# Patient Record
Sex: Male | Born: 1975 | Hispanic: Yes | Marital: Married | Smoking: Never smoker
Health system: Southern US, Community
[De-identification: ages and names within clinical notes are randomized; demographics above are authoritative.]

## PROBLEM LIST (undated history)

## (undated) DIAGNOSIS — T7840XA Allergy, unspecified, initial encounter: Secondary | ICD-10-CM

## (undated) HISTORY — DX: Allergy, unspecified, initial encounter: T78.40XA

---

## 2018-08-08 ENCOUNTER — Encounter (HOSPITAL_COMMUNITY): Payer: Self-pay | Admitting: Emergency Medicine

## 2018-08-08 ENCOUNTER — Ambulatory Visit (HOSPITAL_COMMUNITY)
Admission: EM | Admit: 2018-08-08 | Discharge: 2018-08-08 | Disposition: A | Discharge status: Home / Self Care | Payer: Self-pay | Attending: Family Medicine | Admitting: Family Medicine

## 2018-08-08 DIAGNOSIS — M7022 Olecranon bursitis, left elbow: Secondary | ICD-10-CM

## 2018-08-08 MED ORDER — CEPHALEXIN 500 MG PO CAPS: 500.0000 mg | ORAL_CAPSULE | Freq: Four times a day (QID) | ORAL | 0 refills | Status: AC

## 2018-08-08 MED ORDER — NAPROXEN 500 MG PO TABS: 500.0000 mg | ORAL_TABLET | Freq: Two times a day (BID) | ORAL | 0 refills | Status: DC

## 2018-08-08 NOTE — ED Triage Notes (Signed)
Pt here for left elbow pain with some swelling x 2 days

## 2018-08-08 NOTE — Discharge Instructions (Signed)
I believe your elbow pain and swelling is more from inflammation Use anti-inflammatories for pain/swelling. You may take up to 800 mg Ibuprofen every 8 hours with food. You may supplement Ibuprofen with Tylenol [phone] mg every 8 hours.  OR Naprosyn twice daily with food  Wear Ace wrap to help with swelling and apply compression May ice when at home  May fill prescription for Keflex if having worsening redness and pain concerning for infection  Please follow-up if not having any improvement with the above in 1 week or symptoms worsening

## 2018-08-08 NOTE — ED Provider Notes (Signed)
[place]    CSN: [id number] Arrival date & time: 08/08/18  1218     History   Chief Complaint Chief Complaint  Patient presents with  . Elbow Pain    appt 1230    HPI Thomas Chapman is a 43 y.o. male no significant past medical history presenting today for evaluation of left elbow pain.  Patient states that over the past 2 days he has had increased pain, swelling and redness to his left elbow.  Denies any injury.  Denies history of similar.  Denies difficulty moving elbow.  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  Denies numbness or tingling.  HPI  History reviewed. No pertinent past medical history.  There are no active problems to display for this patient.   History reviewed. No pertinent surgical history.     Home Medications    Prior to Admission medications   Medication Sig Start Date End Date Taking? Authorizing Provider  cephALEXin (KEFLEX) 500 MG capsule Take 1 capsule (500 mg total) by mouth 4 (four) times daily for 5 days. 08/08/18 08/13/18  Wieters, Hallie C, PA-C  naproxen (NAPROSYN) 500 MG tablet Take 1 tablet (500 mg total) by mouth 2 (two) times daily. 08/08/18   Wieters, Elesa Hacker, PA-C    Family History Family History  Problem Relation Age of Onset  . Healthy Mother   . Healthy Father     Social History Social History   Tobacco Use  . Smoking status: Never Smoker  . Smokeless tobacco: Never Used  Substance Use Topics  . Alcohol use: Not Currently  . Drug use: Never     Allergies   Patient has no known allergies.   Review of Systems Review of Systems  Constitutional: Negative for fatigue and fever.  Eyes: Negative for redness, itching and visual disturbance.  Respiratory: Negative for shortness of breath.   Cardiovascular: Negative for chest pain and leg swelling.  Gastrointestinal: Negative for nausea and vomiting.  Musculoskeletal: Positive for arthralgias and joint swelling. Negative  for myalgias.  Skin: Positive for color change. Negative for rash and wound.  Neurological: Negative for dizziness, syncope, weakness, light-headedness and headaches.     Physical Exam Triage Vital Signs ED Triage Vitals  Enc Vitals Group     BP 08/08/18 1240 125/89     Pulse Rate 08/08/18 1240 79     Resp 08/08/18 1240 18     Temp 08/08/18 1240 98.1 F (36.7 C)     Temp Source 08/08/18 1240 Oral     SpO2 08/08/18 1240 100 %     Weight --      Height --      Head Circumference --      Peak Flow --      Pain Score 08/08/18 1241 7     Pain Loc --      Pain Edu? --      Excl. in [place]? --    No data found.  Updated Vital Signs BP 125/89 (BP Location: Right Arm)   Pulse 79   Temp 98.1 F (36.7 C) (Oral)   Resp 18   SpO2 100%   Visual Acuity Right Eye Distance:   Left Eye Distance:   Bilateral Distance:    Right Eye Near:   Left Eye Near:    Bilateral Near:     Physical Exam Vitals signs and nursing note reviewed.  Constitutional:      Appearance: He is  well-developed.     Comments: No acute distress  HENT:     Head: Normocephalic and atraumatic.     Nose: Nose normal.  Eyes:     Conjunctiva/sclera: Conjunctivae normal.  Neck:     Musculoskeletal: Neck supple.  Cardiovascular:     Rate and Rhythm: Normal rate.  Pulmonary:     Effort: Pulmonary effort is normal. No respiratory distress.  Abdominal:     General: There is no distension.  Musculoskeletal: Normal range of motion.     Comments: Left elbow with mild swelling over olecranon bursa and to proximal forearm posteriorly, mild faint erythema, mild warmth Full active range of motion at elbow Radial pulse 2+  Skin:    General: Skin is warm and dry.  Neurological:     Mental Status: He is alert and oriented to person, place, and time.      UC Treatments / Results  Labs (all labs ordered are listed, but only abnormal results are displayed) Labs Reviewed - No data to  display  EKG None  Radiology No results found.  Procedures Procedures (including critical care time)  Medications Ordered in UC Medications - No data to display  Initial Impression / Assessment and Plan / UC Course  I have reviewed the triage vital signs and the nursing notes.  Pertinent labs & imaging results that were available during my care of the patient were reviewed by me and considered in my medical decision making (see chart for details).     Symptoms seem likely from olecranon bursitis, does not seem infectious at this time is full range of motion erythema faint.  Will recommend NSAIDs, Ace wrap and ice.  Did provide prescription for Keflex given patient and his wife's concern and advised that if redness becoming more prominent and having worsening pain may fill, if still not resolving please follow-up.  Continue to monitor,Discussed strict return precautions. Patient verbalized understanding and is agreeable with plan.  Final Clinical Impressions(s) / UC Diagnoses   Final diagnoses:  Olecranon bursitis of left elbow     Discharge Instructions     I believe your elbow pain and swelling is more from inflammation Use anti-inflammatories for pain/swelling. You may take up to 800 mg Ibuprofen every 8 hours with food. You may supplement Ibuprofen with Tylenol [phone] mg every 8 hours.  OR Naprosyn twice daily with food  Wear Ace wrap to help with swelling and apply compression May ice when at home  May fill prescription for Keflex if having worsening redness and pain concerning for infection  Please follow-up if not having any improvement with the above in 1 week or symptoms worsening   ED Prescriptions    Medication Sig Dispense Auth. Provider   naproxen (NAPROSYN) 500 MG tablet Take 1 tablet (500 mg total) by mouth 2 (two) times daily. 30 tablet Wieters, Hallie C, PA-C   cephALEXin (KEFLEX) 500 MG capsule Take 1 capsule (500 mg total) by mouth 4 (four) times  daily for 5 days. 20 capsule Wieters, Hallie C, PA-C     Controlled Substance Prescriptions  Controlled Substance Registry consulted? Not Applicable   Janith Lima, [place] 08/08/18 1314

## 2019-11-29 ENCOUNTER — Other Ambulatory Visit: Payer: Self-pay | Admitting: Medical

## 2019-11-29 ENCOUNTER — Other Ambulatory Visit: Payer: Self-pay

## 2019-11-29 ENCOUNTER — Encounter: Payer: Self-pay | Admitting: Medical

## 2019-11-29 ENCOUNTER — Ambulatory Visit (INDEPENDENT_AMBULATORY_CARE_PROVIDER_SITE_OTHER): Payer: Self-pay | Admitting: Medical

## 2019-11-29 VITALS — BP 130/80 | HR 99 | Resp 18 | Ht 66.0 in | Wt 135.0 lb

## 2019-11-29 DIAGNOSIS — K6289 Other specified diseases of anus and rectum: Secondary | ICD-10-CM

## 2019-11-29 DIAGNOSIS — J301 Allergic rhinitis due to pollen: Secondary | ICD-10-CM

## 2019-11-29 DIAGNOSIS — R42 Dizziness and giddiness: Secondary | ICD-10-CM

## 2019-11-29 DIAGNOSIS — R030 Elevated blood-pressure reading, without diagnosis of hypertension: Secondary | ICD-10-CM

## 2019-11-29 DIAGNOSIS — R5383 Other fatigue: Secondary | ICD-10-CM

## 2019-11-29 MED ORDER — HYDROCORTISONE ACETATE 25 MG RE SUPP: 25.0000 mg | Freq: Two times a day (BID) | RECTAL | 0 refills | Status: DC

## 2019-11-29 MED ORDER — CETIRIZINE HCL 10 MG PO TABS: 10.0000 mg | ORAL_TABLET | Freq: Every day | ORAL | 3 refills | Status: DC

## 2019-11-29 MED ORDER — FLUTICASONE PROPIONATE 50 MCG/ACT NA SUSP: 2.0000 | Freq: Every day | NASAL | 1 refills | Status: DC

## 2019-11-29 NOTE — Progress Notes (Signed)
Subjective:    Patient ID: Thomas Chapman, male    DOB: 1975/10/01, 44 y.o.   MRN: [id number]  HPI  Pt in for first time.  Pt works Architect. Pt states recently trying to eat better. Less spicy. No alcohol use. Stopped sodas one week. Non smoker.  Pt states feels good today.   He states he feels good. Here since 2 years did not get physical.   He also states occasional dizziness. Mild and transient. usually he get with allergies. Usually in spring only. Recently no nasal congestion, no runny nose and not sneezing. No current dizziness.  Also he feels mild fatigue recently.    Pt states also recently when eats it will be spicy foods. He states some slight burning when he defecated for 2 weeks.. Now he has some pain minimal on and off now. He is about 70% better. Pt wife gave him medications to stop fungus.      Review of Systems  Constitutional: Positive for fatigue. Negative for chills and fever.  HENT:       Allergy history.  Respiratory: Negative for cough, chest tightness, shortness of breath and wheezing.   Cardiovascular: Negative for chest pain and palpitations.  Gastrointestinal: Negative for abdominal pain.  Musculoskeletal: Negative for back pain.  Skin: Negative for rash.  Neurological: Positive for dizziness.       Rare dizziness.  Hematological: Negative for adenopathy. Does not bruise/bleed easily.  Psychiatric/Behavioral: Negative for behavioral problems and decreased concentration. The patient is not nervous/anxious.     No past medical history on file.   Social History   Socioeconomic History   Marital status: Married    Spouse name: Not on file   Number of children: Not on file   Years of education: Not on file   Highest education level: Not on file  Occupational History   Not on file  Tobacco Use   Smoking status: Never Smoker   Smokeless tobacco: Never Used  Substance and Sexual Activity   Alcohol use: Not Currently   Drug use:  Never   Sexual activity: Not on file  Other Topics Concern   Not on file  Social History Narrative   Not on file   Social Determinants of Health   Financial Resource Strain:    Difficulty of Paying Living Expenses:   Food Insecurity:    Worried About [place] in the Last Year:    Arboriculturist in the Last Year:   Transportation Needs:    Film/video editor (Medical):    Lack of Transportation (Non-Medical):   Physical Activity:    Days of Exercise per Week:    Minutes of Exercise per Session:   Stress:    Feeling of Stress :   Social Connections:    Frequency of Communication with Friends and Family:    Frequency of Social Gatherings with Friends and Family:    Attends Religious Services:    Active Member of Clubs or Organizations:    Attends Music therapist:    Marital Status:   Intimate Partner Violence:    Fear of Current or Ex-Partner:    Emotionally Abused:    Physically Abused:    Sexually Abused:     No past surgical history on file.  Family History  Problem Relation Age of Onset   Healthy Mother    Healthy Father     No Known Allergies  Current Outpatient Medications on File Prior to  Visit  Medication Sig Dispense Refill   naproxen (NAPROSYN) 500 MG tablet Take 1 tablet (500 mg total) by mouth 2 (two) times daily. (Patient not taking: Reported on 11/29/2019) 30 tablet 0   No current facility-administered medications on file prior to visit.    BP (!) 147/82 (BP Location: Left Arm, Patient Position: Sitting, Cuff Size: Normal)    Pulse 99    Resp 18    Ht 5\' 6"  (1.676 m)    Wt 135 lb (61.2 kg)    SpO2 98%    BMI 21.79 kg/m       Objective:   Physical Exam  General  Mental Status - Alert. General Appearance - Well groomed. Not in acute distress.  Skin Rashes- No Rashes.  HEENT Head- Normal. Ear Auditory Canal - Left- Normal. Right - Normal.Tympanic Membrane- Left- Normal. Right-  Normal. Eye Sclera/Conjunctiva- Left- Normal. Right- Normal. Nose & Sinuses Nasal Mucosa- Left-  Boggy and Congested. Right-  Boggy and  Congested.Bilateral  No maxillary and no frontal sinus pressure. Mouth & Throat Lips: Upper Lip- Normal: no dryness, cracking, pallor, cyanosis, or vesicular eruption. Lower Lip-Normal: no dryness, cracking, pallor, cyanosis or vesicular eruption. Buccal Mucosa- Bilateral- No Aphthous ulcers. Oropharynx- No Discharge or Erythema. Tonsils: Characteristics- Bilateral- No Erythema or Congestion. Size/Enlargement- Bilateral- No enlargement. Discharge- bilateral-None.  Neck Neck- Supple. No Masses.   Chest and Lung Exam Auscultation: Breath Sounds:-Clear even and unlabored.  Cardiovascular Auscultation:Rythm- Regular, rate and rhythm. Murmurs & Other Heart Sounds:Ausculatation of the heart reveal- No Murmurs.  Lymphatic Head & Neck General Head & Neck Lymphatics: Bilateral: Description- No Localized lymphadenopathy.   Rectum- no obvious hemorrhoid. But on exam area about 9 o clock. Tender area raised area appears minimal inflamed.     Assessment & Plan:  You do have history of intermittent and transient dizziness that seems to be associated with allergic rhinitis in the past.  Also some recent fatigue.  For this complaints will get CBC, CMP and TSH.  Giving you written print prescription for Zyrtec and Flonase.  You can use any for allergy signs/symptoms.  We will see if any possible cause for transient dizziness on lab work.   Your recent rectal pain might be hemorrhoid related.  Pain is much improved recently but you still have slight tender area on right side rectum.  Will prescribe Anusol HC suppositories and see how you respond.  Stay well-hydrated and eat high-fiber diet.  Try to avoid constipation/excess strain.  If your symptoms persist despite Anusol HC suppository then can refer to GI MD.  You do have borderline elevated blood pressure  today.  Advised low-salt diet.  Will get lipid panel with today's labs.  Follow-up in 3 weeks or as needed.   Mackie Pai, PA-C   Time spent with patient today was new  30  minutes which consisted of discussing diagnoses, work up, treatment, answering and documentation.

## 2019-11-29 NOTE — Patient Instructions (Addendum)
You do have history of intermittent and transient dizziness that seems to be associated with allergic rhinitis in the past.  Also some recent fatigue.  For this complaints will get CBC, CMP and TSH.  Giving you written print prescription for Zyrtec and Flonase.  You can use any for allergy signs/symptoms.  We will see if any possible cause for transient dizziness on lab work.   Your recent rectal pain might be hemorrhoid related.  Pain is much improved recently but you still have slight tender area on right side rectum.  Will prescribe Anusol HC suppositories and see how you respond.  Stay well-hydrated and eat high-fiber diet.  Try to avoid constipation/excess strain.  If your symptoms persist despite Anusol HC suppository then can refer to GI MD.  You do have borderline elevated blood pressure today.  Advised low-salt diet.  Will get lipid panel with today's labs.  Follow-up in 3 weeks or as needed.

## 2019-11-30 LAB — COMPREHENSIVE METABOLIC PANEL
AG Ratio: 1.7 (calc) (ref 1.0–2.5)
ALT: 14 U/L (ref 9–46)
AST: 16 U/L (ref 10–40)
Albumin: 4.5 g/dL (ref 3.6–5.1)
Alkaline phosphatase (APISO): 86 U/L (ref 36–130)
BUN: 19 mg/dL (ref 7–25)
CO2: 27 mmol/L (ref 20–32)
Calcium: 9.4 mg/dL (ref 8.6–10.3)
Chloride: 104 mmol/L (ref 98–110)
Creat: 1.09 mg/dL (ref 0.60–1.35)
Globulin: 2.6 g/dL (calc) (ref 1.9–3.7)
Glucose, Bld: 93 mg/dL (ref 65–99)
Potassium: 4 mmol/L (ref 3.5–5.3)
Sodium: 140 mmol/L (ref 135–146)
Total Bilirubin: 1 mg/dL (ref 0.2–1.2)
Total Protein: 7.1 g/dL (ref 6.1–8.1)

## 2019-11-30 LAB — CBC WITH DIFFERENTIAL/PLATELET
Absolute Monocytes: 364 cells/uL (ref 200–950)
Basophils Absolute: 31 cells/uL (ref 0–200)
Basophils Relative: 0.6 %
Eosinophils Absolute: 52 cells/uL (ref 15–500)
Eosinophils Relative: 1 %
HCT: 43 % (ref 38.5–50.0)
Hemoglobin: 14.2 g/dL (ref 13.2–17.1)
Lymphs Abs: 1258 cells/uL (ref 850–3900)
MCH: 27.5 pg (ref 27.0–33.0)
MCHC: 33 g/dL (ref 32.0–36.0)
MCV: 83.2 fL (ref 80.0–100.0)
MPV: 9.2 fL (ref 7.5–12.5)
Monocytes Relative: 7 %
Neutro Abs: 3494 cells/uL (ref 1500–7800)
Neutrophils Relative %: 67.2 %
Platelets: 288 10*3/uL (ref 140–400)
RBC: 5.17 10*6/uL (ref 4.20–5.80)
RDW: 12.5 % (ref 11.0–15.0)
Total Lymphocyte: 24.2 %
WBC: 5.2 10*3/uL (ref 3.8–10.8)

## 2019-11-30 LAB — LIPID PANEL
Cholesterol: 175 mg/dL (ref <200)
HDL: 56 mg/dL (ref >=40)
LDL Cholesterol (Calc): 102 mg/dL (calc) — ABNORMAL HIGH
Non-HDL Cholesterol (Calc): 119 mg/dL (calc) (ref <130)
Total CHOL/HDL Ratio: 3.1 (calc) (ref <5.0)
Triglycerides: 77 mg/dL (ref <150)

## 2019-11-30 LAB — TSH: TSH: 0.56 mIU/L (ref 0.40–4.50)

## 2019-12-27 ENCOUNTER — Other Ambulatory Visit: Payer: Self-pay

## 2019-12-27 ENCOUNTER — Ambulatory Visit (INDEPENDENT_AMBULATORY_CARE_PROVIDER_SITE_OTHER): Payer: Self-pay | Admitting: Medical

## 2019-12-27 VITALS — BP 136/70 | HR 66 | Resp 18 | Ht 66.0 in | Wt 176.0 lb

## 2019-12-27 DIAGNOSIS — K6289 Other specified diseases of anus and rectum: Secondary | ICD-10-CM

## 2019-12-27 DIAGNOSIS — J301 Allergic rhinitis due to pollen: Secondary | ICD-10-CM

## 2019-12-27 DIAGNOSIS — R42 Dizziness and giddiness: Secondary | ICD-10-CM

## 2019-12-27 NOTE — Progress Notes (Signed)
Subjective:    Patient ID: Thomas Chapman, male    DOB: December 28, 1975, 44 y.o.   MRN: [id number]  HPI  Pt in states his dizziness has resolved. He states he is eating healthy diet. A lot of fruits and vegetables. Stopped eating junk foods and symptoms resolved. No reoccurence.  Pt did use flonase and zyrtec briefly but not using now.  Pt has some minimal burning pain in rectum. Better but still present. States feels pain more at night. Hurts minimal when uses bathroom. Pt symptoms still present despite use of anusol hc suppository. Pt having no constipation. Pain more when he sits down long time.  Pt bp is better than last time.      Review of Systems  Constitutional: Negative for chills, fatigue and fever.  Respiratory: Negative for cough, chest tightness, shortness of breath and wheezing.   Cardiovascular: Negative for chest pain and palpitations.  Gastrointestinal: Positive for rectal pain. Negative for abdominal distention, abdominal pain, constipation, nausea and vomiting.  Genitourinary: Negative for dysuria.  Musculoskeletal: Negative for back pain.  Skin: Negative for rash.  Neurological: Negative for dizziness and headaches.  Hematological: Negative for adenopathy. Does not bruise/bleed easily.  Psychiatric/Behavioral: Negative for behavioral problems and decreased concentration.    No past medical history on file.   Social History   Socioeconomic History  . Marital status: Married    Spouse name: Not on file  . Number of children: Not on file  . Years of education: Not on file  . Highest education level: Not on file  Occupational History  . Not on file  Tobacco Use  . Smoking status: Never Smoker  . Smokeless tobacco: Never Used  Substance and Sexual Activity  . Alcohol use: Not Currently  . Drug use: Never  . Sexual activity: Not on file  Other Topics Concern  . Not on file  Social History Narrative  . Not on file   Social Determinants of Health    Financial Resource Strain:   . Difficulty of Paying Living Expenses:   Food Insecurity:   . Worried About Charity fundraiser in the Last Year:   . Arboriculturist in the Last Year:   Transportation Needs:   . Film/video editor (Medical):   Marland Kitchen Lack of Transportation (Non-Medical):   Physical Activity:   . Days of Exercise per Week:   . Minutes of Exercise per Session:   Stress:   . Feeling of Stress :   Social Connections:   . Frequency of Communication with Friends and Family:   . Frequency of Social Gatherings with Friends and Family:   . Attends Religious Services:   . Active Member of Clubs or Organizations:   . Attends Archivist Meetings:   Marland Kitchen Marital Status:   Intimate Partner Violence:   . Fear of Current or Ex-Partner:   . Emotionally Abused:   Marland Kitchen Physically Abused:   . Sexually Abused:     No past surgical history on file.  Family History  Problem Relation Age of Onset  . Healthy Mother   . Healthy Father     No Known Allergies  Current Outpatient Medications on File Prior to Visit  Medication Sig Dispense Refill  . cetirizine (ZYRTEC) 10 MG tablet Take 1 tablet (10 mg total) by mouth daily. 30 tablet 3  . fluticasone (FLONASE) 50 MCG/ACT nasal spray Place 2 sprays into both nostrils daily. 16 g 1  . hydrocortisone (ANUSOL-HC) 25 MG  suppository Place 1 suppository (25 mg total) rectally 2 (two) times daily. 14 suppository 0  . naproxen (NAPROSYN) 500 MG tablet Take 1 tablet (500 mg total) by mouth 2 (two) times daily. (Patient not taking: Reported on 11/29/2019) 30 tablet 0   No current facility-administered medications on file prior to visit.    BP 136/70 (BP Location: Left Arm, Patient Position: Sitting, Cuff Size: Large)   Pulse 66   Resp 18   Ht 5\' 6"  (1.676 m)   Wt 176 lb (79.8 kg)   SpO2 98%   BMI 28.41 kg/m       Objective:   Physical Exam  General Mental Status- Alert. General Appearance- Not in acute distress.    Skin General: Color- Normal Color. Moisture- Normal Moisture.  Neck Carotid Arteries- Normal color. Moisture- Normal Moisture. No carotid bruits. No JVD.  Chest and Lung Exam Auscultation: Breath Sounds:-Normal.  Cardiovascular Auscultation:Rythm- Regular. Murmurs & Other Heart Sounds:Auscultation of the heart reveals- No Murmurs.  Abdomen Inspection:-Inspeection Normal. Palpation/Percussion:Note:No mass. Palpation and Percussion of the abdomen reveal- Non Tender, Non Distended + BS, no rebound or guarding.   Neurologic Cranial Nerve exam:- CN III-XII intact(No nystagmus), symmetric smile. Strength:- 5/5 equal and symmetric strength both upper and lower extremities.  Rectal- deferred since referring to GI.     Assessment & Plan:  Your bp is better today. Eating health and daily exercise can help keep bp controlled.  Glad to hear that dizziness resolved completely. If it returns please notify us.  For any allergy signs or symptoms can restart zyrtec and flonase.  For rectal pain that failed annusol hc supp treatment will refer you to gastroenterologist.  Follow one month or as needed  Mackie Pai, PA-C   Time spent with patient today was 25  minutes which consisted of chart review, discussing diagnosis, work up, treatment, answering questions  and documentation.

## 2019-12-27 NOTE — Patient Instructions (Addendum)
Your bp is better today. Eating health and daily exercise can help keep bp controlled.  Glad to hear that dizziness resolved completely. If it returns please notify us.  For any allergy signs or symptoms can restart zyrtec and flonase.  For rectal pain that failed annusol hc supp treatment will refer you to gastroenterologist.  Follow one month or as needed

## 2019-12-31 ENCOUNTER — Encounter: Payer: Self-pay | Admitting: Gastroenterology

## 2020-02-18 ENCOUNTER — Encounter: Payer: Self-pay | Admitting: Gastroenterology

## 2020-02-18 ENCOUNTER — Ambulatory Visit: Payer: Self-pay | Admitting: Gastroenterology

## 2020-02-18 VITALS — BP 130/76 | HR 92 | Ht 64.0 in | Wt 161.4 lb

## 2020-02-18 DIAGNOSIS — K5900 Constipation, unspecified: Secondary | ICD-10-CM

## 2020-02-18 DIAGNOSIS — K640 First degree hemorrhoids: Secondary | ICD-10-CM

## 2020-02-18 DIAGNOSIS — K602 Anal fissure, unspecified: Secondary | ICD-10-CM

## 2020-02-18 MED ORDER — AMBULATORY NON FORMULARY MEDICATION: 0 refills | Status: DC

## 2020-02-18 NOTE — Progress Notes (Signed)
Chief Complaint: Rectal pain  Referring Provider:    Mackie Pai, PA-C    HPI:    Thomas Chapman is a 44 y.o. male referred [facility] for evaluation of rectal discomfort. Sxs started approx 3-4 months. No prior similar sxs. Described as burning discomfort with BM, that he thought was attributed to spicy foods. Stopped spicy foods, but ongoing sxs. Sxs constant with some days being more bothersome. Sitting and BM can exacerbate burning, but not always. No hematochezia or melena. No abdominal pain, n/v/f/c.   Was seen by his PCM for this issue on 11/29/2019.  No improvement with trial of Anusol HC suppository. No change with dietary mods.   No previous EGD or colonoscopy.  No abdominal imaging for review today.  -11/29/2019: Normal CBC and CMP  No known family history of CRC, GI malignancy, liver disease, pancreatic disease, or IBD.   History reviewed. No pertinent past medical history. No previous surgery.    History reviewed. No pertinent surgical history. Family History  Problem Relation Age of Onset  . Healthy Mother   . Healthy Father   . Uterine cancer Brother   . Colon cancer Neg Hx   . Esophageal cancer Neg Hx    Social History   Tobacco Use  . Smoking status: Never Smoker  . Smokeless tobacco: Never Used  Vaping Use  . Vaping Use: Never used  Substance Use Topics  . Alcohol use: Not Currently  . Drug use: Never   Current Outpatient Medications  Medication Sig Dispense Refill  . cetirizine (ZYRTEC) 10 MG tablet Take 1 tablet (10 mg total) by mouth daily. (Patient not taking: Reported on 02/18/2020) 30 tablet 3  . fluticasone (FLONASE) 50 MCG/ACT nasal spray Place 2 sprays into both nostrils daily. (Patient not taking: Reported on 02/18/2020) 16 g 1  . hydrocortisone (ANUSOL-HC) 25 MG suppository Place 1 suppository (25 mg total) rectally 2 (two) times daily. (Patient not taking: Reported on 02/18/2020) 14 suppository 0  . naproxen  (NAPROSYN) 500 MG tablet Take 1 tablet (500 mg total) by mouth 2 (two) times daily. (Patient not taking: Reported on 11/29/2019) 30 tablet 0   No current facility-administered medications for this visit.   No Known Allergies   Review of Systems: All systems reviewed and negative except where noted in HPI.     Physical Exam:    Wt Readings from Last 3 Encounters:  02/18/20 161 lb 6 oz (73.2 kg)  12/27/19 176 lb (79.8 kg)  11/29/19 135 lb (61.2 kg)    BP 130/76   Pulse 92   Ht 5\' 4"  (1.626 m)   Wt 161 lb 6 oz (73.2 kg)   BMI 27.70 kg/m  Constitutional:  Pleasant, in no acute distress. Psychiatric: Normal mood and affect. Behavior is normal. EENT: Pupils normal.  Conjunctivae are normal. No scleral icterus. Neck supple. No cervical LAD. Cardiovascular: Normal rate, regular rhythm. No edema Pulmonary/chest: Effort normal and breath sounds normal. No wheezing, rales or rhonchi. Abdominal: Soft, nondistended, nontender. Bowel sounds active throughout. There are no masses palpable. No hepatomegaly. Neurological: Alert and oriented to person place and time. Skin: Skin is warm and dry. No rashes noted. Rectal exam: Sensation intact and preserved anal wink.  Small palpable internal anal fissures.  No external hemorrhoids or skin tags. Normal sphincter tone. No palpable mass. No blood on the exam glove.  Anoscopy with 3 columns of grade 1  internal hemorrhoids, visible internal anal fissure in 9 o'clock position.  (Chaperone: Curlene Labrum, CMA).     ASSESSMENT AND PLAN;   1) Anal fissure 2) Dyschezia Physical exam notable for external anal fissure in the 9:00 position. Will treat as below:  - Start topical NTG 0.125%, apply a small, pea-sized amount to the affected area BID for 6 weeks. -Already stopped Anusol.  No plan to resume at this juncture - We discussed the ADR of headache, and if patient does experience, to call me and will change and make pharmacy request to compound  topical CCB (topical nifedipine 0.2-0.3% applied 2-4 times daily; unfortunately, the CCB also carries an ADR of headache in 5-12%). Additionally, cautioned to avoid strenuous activity within 30 minutes of application -Colace 912 mg bid during treatment course -Can add fiber supplement for a goal of regular, soft, stools without straining to have a bowel movement.  - Sitz bath with warm water for 10-15 minutes 2-3 times daily; directed to [employer] available online or at [employer]; ensure to dry area afterwards - If no improvement with appropriate trial of therapy, will either change meds or can consider endosocpic evaluation  - To f/u in the clinic in 3 months or sooner prn  3) Internal hemorrhoids -Conservative management for concomitant anal fissure as above.  Otherwise, no hemorrhoid therapy (i.e. topical steroid) as this can impede fissure healing    Lavena Bullion, DO, FACG  02/18/2020, 2:47 PM   Saguier, Percell Miller, PA-C

## 2020-02-18 NOTE — Patient Instructions (Addendum)
If you are age 44 or older, your body mass index should be between 23-30. Your Body mass index is 27.7 kg/m. If this is out of the aforementioned range listed, please consider follow up with your Primary Care Provider.  If you are age 71 or younger, your body mass index should be between 19-25. Your Body mass index is 27.7 kg/m. If this is out of the aformentioned range listed, please consider follow up with your Primary Care Provider.   We have sent the following medications to your pharmacy for you to pick up at your convenience: Nitroglycerin ointment at [facility]  Take Colace 100 mg twice daily until heals.  It was a pleasure to see you today!  Vito Cirigliano, D.O.

## 2020-05-22 ENCOUNTER — Emergency Department (HOSPITAL_BASED_OUTPATIENT_CLINIC_OR_DEPARTMENT_OTHER)
Admission: EM | Admit: 2020-05-22 | Discharge: 2020-05-22 | Disposition: A | Discharge status: Home / Self Care | Payer: Self-pay

## 2020-05-22 ENCOUNTER — Other Ambulatory Visit: Payer: Self-pay
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
## 2020-06-16 ENCOUNTER — Telehealth (INDEPENDENT_AMBULATORY_CARE_PROVIDER_SITE_OTHER): Payer: Self-pay | Admitting: Medical

## 2020-06-16 ENCOUNTER — Other Ambulatory Visit: Payer: Self-pay

## 2020-06-16 ENCOUNTER — Encounter: Payer: Self-pay | Admitting: *Deleted

## 2020-06-16 ENCOUNTER — Encounter: Payer: Self-pay | Admitting: Medical

## 2020-06-16 VITALS — Temp 98.3°F

## 2020-06-16 DIAGNOSIS — J440 Chronic obstructive pulmonary disease with acute lower respiratory infection: Secondary | ICD-10-CM

## 2020-06-16 DIAGNOSIS — J018 Other acute sinusitis: Secondary | ICD-10-CM

## 2020-06-16 DIAGNOSIS — J209 Acute bronchitis, unspecified: Secondary | ICD-10-CM

## 2020-06-16 DIAGNOSIS — R059 Cough, unspecified: Secondary | ICD-10-CM

## 2020-06-16 MED ORDER — BENZONATATE 100 MG PO CAPS: 100.0000 mg | ORAL_CAPSULE | Freq: Three times a day (TID) | ORAL | 0 refills | Status: DC | PRN

## 2020-06-16 MED ORDER — AZITHROMYCIN 250 MG PO TABS: ORAL_TABLET | ORAL | 0 refills | Status: DC
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
## 2020-06-17 ENCOUNTER — Other Ambulatory Visit: Payer: Self-pay

## 2020-06-17 ENCOUNTER — Emergency Department (HOSPITAL_BASED_OUTPATIENT_CLINIC_OR_DEPARTMENT_OTHER)
Admission: EM | Admit: 2020-06-17 | Discharge: 2020-06-18 | Disposition: A | Discharge status: Home / Self Care | Payer: Self-pay | Attending: Emergency Medicine | Admitting: Emergency Medicine

## 2020-06-17 ENCOUNTER — Encounter (HOSPITAL_BASED_OUTPATIENT_CLINIC_OR_DEPARTMENT_OTHER): Payer: Self-pay | Admitting: *Deleted

## 2020-06-17 ENCOUNTER — Emergency Department (HOSPITAL_BASED_OUTPATIENT_CLINIC_OR_DEPARTMENT_OTHER): Payer: Self-pay

## 2020-06-17 DIAGNOSIS — J069 Acute upper respiratory infection, unspecified: Secondary | ICD-10-CM | POA: Insufficient documentation

## 2020-06-17 DIAGNOSIS — R059 Cough, unspecified: Secondary | ICD-10-CM

## 2020-06-17 DIAGNOSIS — R42 Dizziness and giddiness: Secondary | ICD-10-CM | POA: Insufficient documentation

## 2020-06-17 DIAGNOSIS — Z20822 Contact with and (suspected) exposure to covid-19: Secondary | ICD-10-CM | POA: Insufficient documentation

## 2020-06-17 MED ORDER — BENZONATATE 100 MG PO CAPS
100.0000 mg | ORAL_CAPSULE | Freq: Once | ORAL | Status: AC
  Administered 2020-06-18: 100 mg via ORAL
  Filled 2020-06-17: qty 1

## 2020-06-17 NOTE — ED Triage Notes (Signed)
covid exposure and sx x 1 week

## 2020-06-17 NOTE — ED Provider Notes (Addendum)
[facility] EMERGENCY DEPARTMENT Provider Note   CSN: [id number] Arrival date & time: 06/17/20  2056     History Chief Complaint  Patient presents with  . covid sx    Thomas Chapman is a 45 y.o. male.  1 week of covid symptoms after exposure. Has had multiple episodes of light headedness. Occurs a couple times a day. Also has an itchy throat with cough. Felt like he couldn't get a full breath especially with ambulation. Improved significantly with rest. Had decreased intake for multiple days and last couple days it has improved.          History reviewed. No pertinent past medical history.  There are no problems to display for this patient.   History reviewed. No pertinent surgical history.     Family History  Problem Relation Age of Onset  . Healthy Mother   . Healthy Father   . Uterine cancer Brother   . Colon cancer Neg Hx   . Esophageal cancer Neg Hx     Social History   Tobacco Use  . Smoking status: Never Smoker  . Smokeless tobacco: Never Used  Vaping Use  . Vaping Use: Never used  Substance Use Topics  . Alcohol use: Not Currently  . Drug use: Never    Home Medications Prior to Admission medications   Medication Sig Start Date End Date Taking? Authorizing Provider  azithromycin (ZITHROMAX) 250 MG tablet Take 2 tablets by mouth on day 1, followed by 1 tablet by mouth daily for 4 days. 06/16/20   Saguier, Ramon DredgeEdward, PA-C  benzonatate (TESSALON) 100 MG capsule Take 1 capsule (100 mg total) by mouth 3 (three) times daily as needed for cough. 06/16/20   Saguier, Ramon DredgeEdward, PA-C    Allergies    Patient has no known allergies.  Review of Systems   Review of Systems  All other systems reviewed and are negative.   Physical Exam Updated Vital Signs BP 108/85   Pulse 72   Temp 98.6 F (37 C)   Resp 18   Ht 5\' 4"  (1.626 m)   Wt 68 kg   SpO2 98%   BMI 25.75 kg/m   Physical Exam Vitals and nursing note reviewed.  Constitutional:       Appearance: He is well-developed and well-nourished.  HENT:     Head: Normocephalic and atraumatic.     Nose: Congestion and rhinorrhea present.     Mouth/Throat:     Mouth: Mucous membranes are moist.  Eyes:     Pupils: Pupils are equal, round, and reactive to light.  Cardiovascular:     Rate and Rhythm: Normal rate.  Pulmonary:     Effort: Pulmonary effort is normal. No respiratory distress.  Abdominal:     General: There is no distension.  Musculoskeletal:        General: Normal range of motion.     Cervical back: Normal range of motion.  Skin:    General: Skin is warm and dry.  Neurological:     General: No focal deficit present.     Mental Status: He is alert.     ED Results / Procedures / Treatments   Labs (all labs ordered are listed, but only abnormal results are displayed) Labs Reviewed - No data to display  EKG None  Radiology DG Chest Portable 1 View  Result Date: 06/17/2020 CLINICAL DATA:  Cough COVID exposure EXAM: PORTABLE CHEST 1 VIEW COMPARISON:  None. FINDINGS: The heart size and mediastinal contours  are within normal limits. Both lungs are clear. The visualized skeletal structures are unremarkable. IMPRESSION: No active disease. Electronically Signed   By: Jasmine Pang M.D.   On: 06/17/2020 21:20    Procedures Procedures (including critical care time)  Medications Ordered in ED Medications  benzonatate (TESSALON) capsule 100 mg (100 mg Oral Given 06/18/20 0000)    ED Course  I have reviewed the triage vital signs and the nursing notes.  Pertinent labs & imaging results that were available during my care of the patient were reviewed by me and considered in my medical decision making (see chart for details).    MDM Rules/Calculators/A&P                          Appears well. Likely covid. Doesn't want tested. Ecg/cxr ok. No indication for further ed workup and management at this time. Anticipatory guidance provided.   Final Clinical  Impression(s) / ED Diagnoses Final diagnoses:  Cough  Upper respiratory tract infection, unspecified type    Rx / DC Orders ED Discharge Orders    None       Jaevion Goto, Barbara Cower, MD 06/18/20 5366    Marily Memos, MD 06/28/20 1112

## 2020-09-25 ENCOUNTER — Ambulatory Visit: Payer: Self-pay | Attending: Internal Medicine

## 2020-09-25 DIAGNOSIS — Z23 Encounter for immunization: Secondary | ICD-10-CM

## 2020-09-25 NOTE — Progress Notes (Signed)
   Covid-19 Vaccination Clinic  Name:  Thomas Chapman    MRN: [id number] DOB: 1975/12/12  09/25/2020  Thomas Chapman was observed post Covid-19 immunization for 15 minutes without incident. He was provided with Vaccine Information Sheet and instruction to access the V-Safe system.   Thomas Chapman was instructed to call 911 with any severe reactions post vaccine: Marland Kitchen Difficulty breathing  . Swelling of face and throat  . A fast heartbeat  . A bad rash all over body  . Dizziness and weakness   Immunizations Administered    Name Date Dose VIS Date Route   PFIZER Comrnaty(Gray TOP) Covid-19 Vaccine 09/25/2020  2:48 PM 0.3 mL 05/21/2020 Intramuscular   Manufacturer: [place]   Lot: [id number]   [place]: [phone]

## 2020-10-01 ENCOUNTER — Other Ambulatory Visit (HOSPITAL_BASED_OUTPATIENT_CLINIC_OR_DEPARTMENT_OTHER): Payer: Self-pay

## 2020-10-01 MED ORDER — PFIZER-BIONT COVID-19 VAC-TRIS 30 MCG/0.3ML IM SUSP
INTRAMUSCULAR | 0 refills | Status: DC
  Filled 2020-10-01: qty 0.3, 1d supply, fill #0

## 2021-02-02 ENCOUNTER — Other Ambulatory Visit (HOSPITAL_BASED_OUTPATIENT_CLINIC_OR_DEPARTMENT_OTHER): Payer: Self-pay

## 2021-03-31 ENCOUNTER — Encounter: Payer: Self-pay | Admitting: Gastroenterology

## 2021-03-31 ENCOUNTER — Other Ambulatory Visit: Payer: Self-pay

## 2021-03-31 ENCOUNTER — Ambulatory Visit (INDEPENDENT_AMBULATORY_CARE_PROVIDER_SITE_OTHER): Payer: Self-pay | Admitting: Gastroenterology

## 2021-03-31 VITALS — BP 120/86 | HR 78 | Ht 64.0 in | Wt 171.0 lb

## 2021-03-31 DIAGNOSIS — Z1211 Encounter for screening for malignant neoplasm of colon: Secondary | ICD-10-CM

## 2021-03-31 DIAGNOSIS — K602 Anal fissure, unspecified: Secondary | ICD-10-CM

## 2021-03-31 DIAGNOSIS — K640 First degree hemorrhoids: Secondary | ICD-10-CM

## 2021-03-31 DIAGNOSIS — Z1212 Encounter for screening for malignant neoplasm of rectum: Secondary | ICD-10-CM

## 2021-03-31 DIAGNOSIS — K5900 Constipation, unspecified: Secondary | ICD-10-CM
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
## 2021-03-31 NOTE — Patient Instructions (Signed)
If you are age 45 or older, your body mass index should be between 23-30. Your Body mass index is 29.35 kg/m. If this is out of the aforementioned range listed, please consider follow up with your Primary Care Provider.  If you are age 46 or younger, your body mass index should be between 19-25. Your Body mass index is 29.35 kg/m. If this is out of the aformentioned range listed, please consider follow up with your Primary Care Provider.   __________________________________________________________  The [facility] providers would like to encourage you to use [facility] to communicate with providers for non-urgent requests or questions.  Due to long hold times on the telephone, sending your provider a message by [facility] may be a faster and more efficient way to get a response.  Please allow 48 business hours for a response.  Please remember that this is for non-urgent requests.   Low FODMAP Diet: (Fermentable Oligosaccharides, Disaccharides, Monosaccharides, and Polyols) These are short chain carbohydrates and sugar alcohols that are poorly absorbed by the body, resulting in multiple abdominal symptoms, including changes in bowel habits, abdominal pain/discomfort, bloating, abdominal distension, gas, etc.        We have sent the following medications to your pharmacy for you to pick up at your convenience:  Ducolax 5 mg tablets for colonoscopy prep Start using a fiber supplement Use Gas x or Beano  We have given you an estimate of the procedure and paperwork for the Patient assistance program through [facility].  Due to recent changes in healthcare laws, you may see the results of your imaging and laboratory studies on MyChart before your provider has had a chance to review them.  We understand that in some cases there may be results that are confusing or concerning to you. Not all laboratory results come back in the same time frame and the provider may be waiting for multiple results in order  to interpret others.  Please give [place] 48 hours in order for your provider to thoroughly review all the results before contacting the office for clarification of your results.   Thank you for choosing me and [facility].  Vito Cirigliano, D.O.

## 2021-05-04 ENCOUNTER — Encounter: Payer: Self-pay | Admitting: Gastroenterology

## 2021-05-04 ENCOUNTER — Other Ambulatory Visit: Payer: Self-pay | Admitting: Gastroenterology

## 2021-05-04 ENCOUNTER — Ambulatory Visit (AMBULATORY_SURGERY_CENTER): Payer: Self-pay | Admitting: Gastroenterology

## 2021-05-04 VITALS — BP 108/68 | HR 91 | Temp 98.9°F | Resp 12 | Ht 64.0 in | Wt 171.0 lb

## 2021-05-04 DIAGNOSIS — K635 Polyp of colon: Secondary | ICD-10-CM

## 2021-05-04 DIAGNOSIS — K5900 Constipation, unspecified: Secondary | ICD-10-CM

## 2021-05-04 DIAGNOSIS — K6289 Other specified diseases of anus and rectum: Secondary | ICD-10-CM

## 2021-05-04 DIAGNOSIS — D125 Benign neoplasm of sigmoid colon: Secondary | ICD-10-CM

## 2021-05-04 DIAGNOSIS — Z1211 Encounter for screening for malignant neoplasm of colon: Secondary | ICD-10-CM

## 2021-05-04 DIAGNOSIS — K603 Anal fistula: Secondary | ICD-10-CM

## 2021-05-04 MED ORDER — METRONIDAZOLE 500 MG PO TABS: ORAL_TABLET | ORAL | 0 refills | Status: AC

## 2021-05-04 MED ORDER — SODIUM CHLORIDE 0.9 % IV SOLN: 500.0000 mL | Freq: Once | INTRAVENOUS | Status: DC

## 2021-05-04 NOTE — Progress Notes (Signed)
Called to room to assist during endoscopic procedure.  Patient ID and intended procedure confirmed with present staff. Received instructions for my participation in the procedure from the performing physician.  

## 2021-05-04 NOTE — Progress Notes (Signed)
VS taken by C.W. 

## 2021-05-04 NOTE — Progress Notes (Signed)
Report to PACU, RN, vss, BBS= Clear.  

## 2021-05-04 NOTE — Patient Instructions (Signed)
Handout on polyps given to patient. Await pathology results. Pick up prescription for Metronidazole from [facility] - you will take 500 mg tablets twice a day for 4 weeks and then 250 mg tablets three times a day for 4 weeks. 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 Citrucel, Fibercon, Konsyl, or Metamucil for a goal of soft stools without straining to have a bowel movement) Perform magnetic resonance imaging (MRI) pelvis with gadolinium at appointment to be scheduled. Return to GI clinic at appointment to be scheduled after completion of MRI Pelvis. Resume previous diet and continue present medications.   YOU HAD AN ENDOSCOPIC PROCEDURE TODAY AT [facility]:   Refer to the procedure report that was given to you for any specific questions about what was found during the examination.  If the procedure report does not answer your questions, please call your gastroenterologist to clarify.  If you requested that your care partner not be given the details of your procedure findings, then the procedure report has been included in a sealed envelope for you to review at your convenience later.  YOU SHOULD EXPECT: Some feelings of bloating in the abdomen. Passage of more gas than usual.  Walking can help get rid of the air that was put into your GI tract during the procedure and reduce the bloating. If you had a lower endoscopy (such as a colonoscopy or flexible sigmoidoscopy) you may notice spotting of blood in your stool or on the toilet paper. If you underwent a bowel prep for your procedure, you may not have a normal bowel movement for a few days.  Please Note:  You might notice some irritation and congestion in your nose or some drainage.  This is from the oxygen used during your procedure.  There is no need for concern and it should clear up in a day or so.  SYMPTOMS TO REPORT  IMMEDIATELY:  Following lower endoscopy (colonoscopy or flexible sigmoidoscopy):  Excessive amounts of blood in the stool  Significant tenderness or worsening of abdominal pains  Swelling of the abdomen that is new, acute  Fever of 100F or higher  For urgent or emergent issues, a gastroenterologist can be reached at any hour by calling [phone]. Do not use MyChart messaging for urgent concerns.    DIET:  We do recommend a small meal at first, but then you may proceed to your regular diet.  Drink plenty of fluids but you should avoid alcoholic beverages for 24 hours.  ACTIVITY:  You should plan to take it easy for the rest of today and you should NOT DRIVE or use heavy machinery until tomorrow (because of the sedation medicines used during the test).    FOLLOW UP: Our staff will call the number listed on your records 48-72 hours following your procedure to check on you and address any questions or concerns that you may have regarding the information given to you following your procedure. If we do not reach you, we will leave a message.  We will attempt to reach you two times.  During this call, we will ask if you have developed any symptoms of COVID 19. If you develop any symptoms (ie: fever, flu-like symptoms, shortness of breath, cough etc.) before then, please call [phone].  If you test positive for Covid 19 in the 2 weeks post procedure, please call and report this information to [place].    If any biopsies  were taken you will be contacted by phone or by letter within the next 1-3 weeks.  Please call us at [phone] if you have not heard about the biopsies in 3 weeks.    SIGNATURES/CONFIDENTIALITY: You and/or your care partner have signed paperwork which will be entered into your electronic medical record.  These signatures attest to the fact that that the information above on your After Visit Summary has been reviewed and is understood.  Full responsibility of the  confidentiality of this discharge information lies with you and/or your care-partner.

## 2021-05-04 NOTE — Progress Notes (Signed)
   GASTROENTEROLOGY PROCEDURE H&P NOTE   Primary Care Physician: Mackie Pai, PA-C    Reason for Procedure:   Anal fissure, dyschezia, internal hemorrhoids, colon cancer screening  Plan:    Colonoscopy  Patient is appropriate for endoscopic procedure(s) in the ambulatory ([facility]) setting.  The nature of the procedure, as well as the risks, benefits, and alternatives were carefully and thoroughly reviewed with the patient. Ample time for discussion and questions allowed. The patient understood, was satisfied, and agreed to proceed.     HPI: Thomas Chapman is a 45 y.o. male who presents for Colonoscopy for CRC screening along with evaluation of anal fissure and hemorrhoids.   Past Medical History:  Diagnosis Date   Allergy     History reviewed. No pertinent surgical history.  Prior to Admission medications   Medication Sig Start Date End Date Taking? Authorizing Provider  COVID-19 mRNA Vac-TriS, Pfizer, (PFIZER-BIONT COVID-19 VAC-TRIS) SUSP injection Inject into the muscle. 09/25/20   Carlyle Basques, MD    Current Outpatient Medications  Medication Sig Dispense Refill   COVID-19 mRNA Vac-TriS, Pfizer, (PFIZER-BIONT COVID-19 VAC-TRIS) SUSP injection Inject into the muscle. 0.3 mL 0   Current Facility-Administered Medications  Medication Dose Route Frequency Provider Last Rate Last Admin   0.9 %  sodium chloride infusion  500 mL Intravenous Once Klaus Casteneda V, DO        Allergies as of 05/04/2021   (No Known Allergies)    Family History  Problem Relation Age of Onset   Healthy Mother    Healthy Father    Uterine cancer Brother    Colon cancer Neg Hx    Esophageal cancer Neg Hx    Stomach cancer Neg Hx    Rectal cancer Neg Hx     Social History   Socioeconomic History   Marital status: Married    Spouse name: Not on file   Number of children: 2   Years of education: Not on file   Highest education level: Not on file  Occupational History   Not on  file  Tobacco Use   Smoking status: Never   Smokeless tobacco: Never  Vaping Use   Vaping Use: Never used  Substance and Sexual Activity   Alcohol use: Not Currently   Drug use: Never   Sexual activity: Not on file  Other Topics Concern   Not on file  Social History Narrative   Not on file   Social Determinants of Health   Financial Resource Strain: Not on file  Food Insecurity: Not on file  Transportation Needs: Not on file  Physical Activity: Not on file  Stress: Not on file  Social Connections: Not on file  Intimate Partner Violence: Not on file    Physical Exam: Vital signs in last 24 hours: @BP  132/80   Pulse (!) 104   Temp 98.9 F (37.2 C)   Resp 19   Ht 5\' 4"  (1.626 m)   Wt 171 lb (77.6 kg)   SpO2 100%   BMI 29.35 kg/m  GEN: NAD EYE: Sclerae anicteric ENT: MMM CV: Non-tachycardic Pulm: CTA b/l GI: Soft, NT/ND NEURO:  Alert & Oriented x 3   Gerrit Heck, DO [facility]   05/04/2021 4:07 PM

## 2021-05-04 NOTE — Op Note (Signed)
[place] Patient Name: Thomas Chapman Procedure Date: 05/04/2021 4:03 PM MRN: [id number] Endoscopist: Gerrit Heck , MD Age: 45 Referring MD:  Date of Birth: [birth year] Gender: Male Account #: [id number] Procedure:                Colonoscopy Indications:              Constipation, Rectal pain                           This is the patient's first colonoscopy. Medicines:                Monitored Anesthesia Care Procedure:                Pre-Anesthesia Assessment:                           - Prior to the procedure, a History and Physical                            was performed, and patient medications and                            allergies were reviewed. The patient's tolerance of                            previous anesthesia was also reviewed. The risks                            and benefits of the procedure and the sedation                            options and risks were discussed with the patient.                            All questions were answered, and informed consent                            was obtained. Prior Anticoagulants: The patient has                            taken no previous anticoagulant or antiplatelet                            agents. ASA Grade Assessment: II - A patient with                            mild systemic disease. After reviewing the risks                            and benefits, the patient was deemed in                            satisfactory condition to undergo the procedure.  After obtaining informed consent, the colonoscope                            was passed under direct vision. Throughout the                            procedure, the patient's blood pressure, pulse, and                            oxygen saturations were monitored continuously. The                            Olympus CF-HQ190L (Serial# [id number]) Colonoscope was                            introduced through the anus and advanced to  the the                            terminal ileum. The colonoscopy was performed                            without difficulty. The patient tolerated the                            procedure well. The quality of the bowel                            preparation was excellent. The terminal ileum,                            ileocecal valve, appendiceal orifice, and rectum                            were photographed. Scope In: 4:14:29 PM Scope Out: 4:27:40 PM Scope Withdrawal Time: 0 hours 10 minutes 35 seconds  Total Procedure Duration: 0 hours 13 minutes 11 seconds  Findings:                 The perianal exam findings include perianal                            fistula. The anal verge was otherwise normal on                            anterograde and retroflexed views.                           A 2 mm polyp was found in the sigmoid colon. The                            polyp was sessile. The polyp was removed with a                            cold biopsy forceps. Resection and retrieval were  complete. Estimated blood loss was minimal.                           Normal mucosa was found in the entire colon. No                            areas of mucosal erythema, edema, erosions, or                            ulceration noted.                           The terminal ileum appeared normal.                           The retroflexed view of the distal rectum and anal                            verge was normal and showed no anal or rectal                            abnormalities. Complications:            No immediate complications. Estimated Blood Loss:     Estimated blood loss was minimal. 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                           - One 2 mm polyp in the sigmoid colon, removed with                            a cold biopsy forceps. Resected and retrieved.                           - Normal mucosa in the entire  examined colon.                           - The examined portion of the ileum was normal.                           - The distal rectum and anal verge are normal on                            retroflexion view. Recommendation:           - Patient has a contact number available for                            emergencies. The signs and symptoms of potential                            delayed complications were discussed with the  patient. Return to normal activities tomorrow.                            Written discharge instructions were provided to the                            patient.                           - Resume previous diet.                           - Continue present medications.                           - Await pathology results.                           - Repeat colonoscopy for surveillance based on                            pathology results.                           - Perform magnetic resonance imaging (MRI) Pelvis                            with gadolinium at appointment to be scheduled.                           - Return to GI clinic at appointment to be                            scheduled after completion of MRI Pelvis.                           - Use fiber, for example Citrucel, Fibercon, Konsyl                            or Metamucil for a goal of soft stools without                            straining to have a bowel movement.                           - Start metronidazole 500 mg PO BID x4 weeks, then                            250 mg PO TID x4 weeks.                           - Referral to [facility] for                            evaluation of perianal fistula without associated  Inflammatory Bowel Disease. Gerrit Heck, MD 05/04/2021 4:37:02 PM

## 2021-05-05 ENCOUNTER — Telehealth: Payer: Self-pay

## 2021-05-05 ENCOUNTER — Other Ambulatory Visit: Payer: Self-pay

## 2021-05-05 DIAGNOSIS — K603 Anal fistula: Secondary | ICD-10-CM

## 2021-05-05 DIAGNOSIS — K602 Anal fissure, unspecified: Secondary | ICD-10-CM

## 2021-05-05 NOTE — Telephone Encounter (Signed)
Placed order for MRI at [facility]. MRI will be on 05/12/21 at 8pm. Also scheduled office visit on 05/20/21 at 2:40pm. Called patient to let him know of appointment times and sent mychart message.

## 2021-05-10 ENCOUNTER — Telehealth: Payer: Self-pay | Admitting: *Deleted

## 2021-05-10 NOTE — Telephone Encounter (Signed)
  Follow up Call-  Call back number 05/04/2021  Post procedure Call Back phone  # [phone]  Permission to leave phone message Yes  Some recent data might be hidden     Patient questions:  Do you have a fever, pain , or abdominal swelling? No. Pain Score  0 *  Have you tolerated food without any problems? Yes.    Have you been able to return to your normal activities? Yes.    Do you have any questions about your discharge instructions: Diet   No. Medications  No Follow up visit  No.  Do you have questions or concerns about your Care? No.  Actions: * If pain score is 4 or above: No action needed, pain <4.

## 2021-05-11 ENCOUNTER — Telehealth: Payer: Self-pay | Admitting: Gastroenterology

## 2021-05-11 NOTE — Telephone Encounter (Signed)
Patient called stating he was unaware of the MRI scheduled for him tomorrow and is unable to make that appointment.  It will need to be rescheduled and he would like to be informed as to when it is.  He said the best days for him to have an appointment is later on Fridays (around 3-4 p.m.)  Please call patient and advise.  Thank you.

## 2021-05-11 NOTE — Telephone Encounter (Signed)
Called patient and let him know Dr. Bryan Lemma would like to have the MRI results for his office appt. On 05/20/21. He agreed to go for the MRI tomorrow.

## 2021-05-12 ENCOUNTER — Ambulatory Visit (HOSPITAL_COMMUNITY)
Admission: RE | Admit: 2021-05-12 | Discharge: 2021-05-12 | Disposition: A | Discharge status: Home / Self Care | Payer: Self-pay | Source: Ambulatory Visit | Attending: Gastroenterology | Admitting: Gastroenterology

## 2021-05-12 ENCOUNTER — Other Ambulatory Visit: Payer: Self-pay

## 2021-05-12 DIAGNOSIS — K603 Anal fistula: Secondary | ICD-10-CM | POA: Insufficient documentation

## 2021-05-12 MED ORDER — GADOBUTROL 1 MMOL/ML IV SOLN
7.0000 mL | Freq: Once | INTRAVENOUS | Status: AC | PRN
  Administered 2021-05-12: 7 mL via INTRAVENOUS

## 2021-05-13 ENCOUNTER — Telehealth: Payer: Self-pay | Admitting: Gastroenterology

## 2021-05-13 ENCOUNTER — Encounter: Payer: Self-pay | Admitting: Gastroenterology

## 2021-05-13 ENCOUNTER — Other Ambulatory Visit: Payer: Self-pay

## 2021-05-13 MED ORDER — NIFEDIPINE 0.3 % OINTMENT: 1.0000 "application " | TOPICAL_OINTMENT | Freq: Four times a day (QID) | CUTANEOUS | 1 refills | Status: DC

## 2021-05-13 NOTE — Telephone Encounter (Signed)
Returned call to [facility], spoke with Malachy Mood in regards to call report.  IMPRESSION: 1. No evidence of anal fistula tract or other inflammatory findings in the pelvis. 2. Focal marrow edema and contrast enhancement of the lateral aspect of the right femoral neck. This is of uncertain significance, an unusual location for stress fracture in this patient. As the hip is incompletely assessed on this non tailored examination of the pelvis, correlation with dedicated radiographs or MRI of the right hip may be helpful.

## 2021-05-13 NOTE — Telephone Encounter (Signed)
Noted, see 11/30 MRI result note for details.

## 2021-05-19 ENCOUNTER — Other Ambulatory Visit: Payer: Self-pay | Admitting: Gastroenterology

## 2021-05-19 MED ORDER — NIFEDIPINE 0.3 % OINTMENT: 1.0000 "application " | TOPICAL_OINTMENT | Freq: Four times a day (QID) | CUTANEOUS | 1 refills | Status: DC

## 2021-05-19 NOTE — Telephone Encounter (Signed)
Patient called and stated the pharmacy called him to let him know they do not have the medication and he is wanting to follow up because he has no medication at this time.

## 2021-05-19 NOTE — Telephone Encounter (Signed)
Contacted the [facility] and spoke with Orene Desanctis, shows nifedipine  was sent to [facility] but the medication is a compounded drug and they do not compound. Cancelled the rx and sent it to [facility] where all compounds need to be sent.

## 2021-05-20 ENCOUNTER — Ambulatory Visit: Payer: Self-pay | Admitting: Gastroenterology

## 2021-05-28 ENCOUNTER — Ambulatory Visit (INDEPENDENT_AMBULATORY_CARE_PROVIDER_SITE_OTHER): Payer: Self-pay | Admitting: Medical

## 2021-05-28 ENCOUNTER — Ambulatory Visit (HOSPITAL_BASED_OUTPATIENT_CLINIC_OR_DEPARTMENT_OTHER)
Admission: RE | Admit: 2021-05-28 | Discharge: 2021-05-28 | Disposition: A | Discharge status: Home / Self Care | Payer: Self-pay | Source: Ambulatory Visit | Attending: Medical | Admitting: Medical

## 2021-05-28 ENCOUNTER — Other Ambulatory Visit: Payer: Self-pay

## 2021-05-28 VITALS — BP 102/60 | HR 84 | Ht 64.0 in | Wt 169.8 lb

## 2021-05-28 DIAGNOSIS — F419 Anxiety disorder, unspecified: Secondary | ICD-10-CM

## 2021-05-28 DIAGNOSIS — M25559 Pain in unspecified hip: Secondary | ICD-10-CM

## 2021-05-28 DIAGNOSIS — R5383 Other fatigue: Secondary | ICD-10-CM

## 2021-05-28 NOTE — Patient Instructions (Addendum)
You had abnormal fiinding on mri that read  "2. Focal marrow edema and contrast enhancement of the lateral aspect of the right femoral neck. This is of uncertain significance, an unusual location for stress fracture in this patient. As the hip is incompletely assessed on this non tailored examination of the pelvis, correlation with dedicated radiographs or MRI of the right hip may be helpful."  I decided to order xray and then may refer to sports medicine to get opinion if mri needed. Understand your concern about mri cost.  Will do fatigue labs today.  For anxiety counseled that if worsens can offer medication buspar. Presently you decline.  Follow up date to be determined after xray review.

## 2021-05-28 NOTE — Progress Notes (Signed)
Subjective:    Patient ID: Thomas Chapman, male    DOB: [birth year], 45 y.o.   MRN: [id number]  HPI  Pt in for evaluation. Pt went to GI MD. Pt has perianal fistula. GI MD ordered mri abd. Report mentioned below.  IMPRESSION: 1. No evidence of anal fistula tract or other inflammatory findings in the pelvis. 2. Focal marrow edema and contrast enhancement of the lateral aspect of the right femoral neck. This is of uncertain significance, an unusual location for stress fracture in this patient. As the hip is incompletely assessed on this non tailored examination of the pelvis, correlation with dedicated radiographs or MRI of the right hip may be helpful.  Pt tells me he feels faint discomfort  in hip area. Nothing that is significant. Slight discomfort is very random.  Sometimes in past doing Karate and stretching would beel pain.  Pt also mentions to me ever since he had covid one year ago feels fatigued and he feels like maybe more anxious than before. Some anxiety daily.  Describes had moderate illness for 2 weeks.    Review of Systems  Constitutional:  Negative for chills, fatigue and fever.  Respiratory:  Negative for chest tightness, shortness of breath and wheezing.   Cardiovascular:  Negative for chest pain and palpitations.  Gastrointestinal:  Negative for abdominal pain, blood in stool and constipation.  Genitourinary:  Negative for difficulty urinating, dysuria, frequency and hematuria.  Musculoskeletal:  Negative for back pain and joint swelling.       Rt hip discmfort Rare. See hpi.   Neurological:  Negative for dizziness, numbness and headaches.  Hematological:  Negative for adenopathy. Does not bruise/bleed easily.  Psychiatric/Behavioral:  Negative for behavioral problems and decreased concentration.     Past Medical History:  Diagnosis Date   Allergy      Social History   Socioeconomic History   Marital status: Married    Spouse name: Not on file    Number of children: 2   Years of education: Not on file   Highest education level: Not on file  Occupational History   Not on file  Tobacco Use   Smoking status: Never   Smokeless tobacco: Never  Vaping Use   Vaping Use: Never used  Substance and Sexual Activity   Alcohol use: Not Currently   Drug use: Never   Sexual activity: Not on file  Other Topics Concern   Not on file  Social History Narrative   Not on file   Social Determinants of Health   Financial Resource Strain: Not on file  Food Insecurity: Not on file  Transportation Needs: Not on file  Physical Activity: Not on file  Stress: Not on file  Social Connections: Not on file  Intimate Partner Violence: Not on file    No past surgical history on file.  Family History  Problem Relation Age of Onset   Healthy Mother    Healthy Father    Uterine cancer Brother    Colon cancer Neg Hx    Esophageal cancer Neg Hx    Stomach cancer Neg Hx    Rectal cancer Neg Hx     No Known Allergies  Current Outpatient Medications on File Prior to Visit  Medication Sig Dispense Refill   metroNIDAZOLE (FLAGYL) 500 MG tablet Take 1 tablet (500 mg total) by mouth 2 (two) times daily for 28 days, THEN 0.5 tablets (250 mg total) 3 (three) times daily for 28 days. 98 tablet 0  No current facility-administered medications on file prior to visit.    BP 102/60    Pulse 84    Ht 5\' 4"  (1.626 m)    Wt 169 lb 12.8 oz (77 kg)    SpO2 98%    BMI 29.15 kg/m       Objective:   Physical Exam  General- No acute distress. Pleasant patient. Lungs- Clear, even and unlabored. Heart- regular rate and rhythm. Neurologic- CNII- XII grossly intact.  Rt hip- on rom/rotation. Slight click/slip tight feel. But no pain.     Assessment & Plan:   Patient Instructions  You had abnormal fiinding on mri that read  "2. Focal marrow edema and contrast enhancement of the lateral aspect of the right femoral neck. This is of uncertain significance,  an unusual location for stress fracture in this patient. As the hip is incompletely assessed on this non tailored examination of the pelvis, correlation with dedicated radiographs or MRI of the right hip may be helpful."  I decided to order xray and then may refer to sports medicine to get opinion if mri needed. Understand your concern about mri cost.  Will do fatigue labs today.  For anxiety counseled that if worsens can offer medication buspar. Presently you decline.  Follow up date to be determined after xray review.   Mackie Pai, PA-C

## 2021-05-30 NOTE — Addendum Note (Signed)
Addended by: Anabel Halon on: 05/30/2021 05:24 PM   Modules accepted: Orders

## 2021-06-01 ENCOUNTER — Other Ambulatory Visit (INDEPENDENT_AMBULATORY_CARE_PROVIDER_SITE_OTHER): Payer: Self-pay

## 2021-06-01 DIAGNOSIS — R5383 Other fatigue: Secondary | ICD-10-CM

## 2021-06-01 LAB — CBC WITH DIFFERENTIAL/PLATELET
Basophils Absolute: 0 10*3/uL (ref 0.0–0.1)
Basophils Relative: 0.5 % (ref 0.0–3.0)
Eosinophils Absolute: 0.1 10*3/uL (ref 0.0–0.7)
Eosinophils Relative: 1.2 % (ref 0.0–5.0)
HCT: 40.2 % (ref 39.0–52.0)
Hemoglobin: 13.4 g/dL (ref 13.0–17.0)
Lymphocytes Relative: 29.4 % (ref 12.0–46.0)
Lymphs Abs: 1.4 10*3/uL (ref 0.7–4.0)
MCHC: 33.3 g/dL (ref 30.0–36.0)
MCV: 84.7 fl (ref 78.0–100.0)
Monocytes Absolute: 0.4 10*3/uL (ref 0.1–1.0)
Monocytes Relative: 7.3 % (ref 3.0–12.0)
Neutro Abs: 3 10*3/uL (ref 1.4–7.7)
Neutrophils Relative %: 61.6 % (ref 43.0–77.0)
Platelets: 255 10*3/uL (ref 150.0–400.0)
RBC: 4.75 Mil/uL (ref 4.22–5.81)
RDW: 12.8 % (ref 11.5–15.5)
WBC: 4.9 10*3/uL (ref 4.0–10.5)

## 2021-06-01 LAB — COMPREHENSIVE METABOLIC PANEL
ALT: 16 U/L (ref 0–53)
AST: 18 U/L (ref 0–37)
Albumin: 4.1 g/dL (ref 3.5–5.2)
Alkaline Phosphatase: 78 U/L (ref 39–117)
BUN: 16 mg/dL (ref 6–23)
CO2: 30 mEq/L (ref 19–32)
Calcium: 9.3 mg/dL (ref 8.4–10.5)
Chloride: 103 mEq/L (ref 96–112)
Creatinine, Ser: 1 mg/dL (ref 0.40–1.50)
GFR: 91.06 mL/min (ref >60.00)
Glucose, Bld: 116 mg/dL — ABNORMAL HIGH (ref 70–99)
Potassium: 3.5 mEq/L (ref 3.5–5.1)
Sodium: 141 mEq/L (ref 135–145)
Total Bilirubin: 0.8 mg/dL (ref 0.2–1.2)
Total Protein: 6.8 g/dL (ref 6.0–8.3)

## 2021-06-01 LAB — TSH: TSH: 2.15 u[IU]/mL (ref 0.35–5.50)

## 2021-06-01 LAB — VITAMIN B12: Vitamin B-12: 243 pg/mL (ref 211–911)

## 2021-06-01 LAB — T4, FREE: Free T4: 0.8 ng/dL (ref 0.60–1.60)

## 2021-06-05 LAB — VITAMIN B1: Vitamin B1 (Thiamine): 15 nmol/L (ref 8–30)

## 2021-06-08 LAB — VITAMIN D 1,25 DIHYDROXY
Vitamin D 1, 25 (OH)2 Total: 26 pg/mL (ref 18–72)
Vitamin D2 1, 25 (OH)2: <8 pg/mL
Vitamin D3 1, 25 (OH)2: 26 pg/mL

## 2021-06-17 ENCOUNTER — Ambulatory Visit: Payer: Self-pay | Admitting: Family Medicine

## 2021-08-05 ENCOUNTER — Ambulatory Visit: Payer: Self-pay | Admitting: Family Medicine

## 2021-08-06 ENCOUNTER — Ambulatory Visit: Payer: Self-pay | Admitting: Gastroenterology

## 2021-09-03 ENCOUNTER — Ambulatory Visit: Payer: Self-pay | Admitting: Gastroenterology

## 2021-09-09 ENCOUNTER — Ambulatory Visit: Payer: Self-pay | Admitting: Family Medicine

## 2022-04-17 IMAGING — MR MR PELVIS WO/W CM
5 of 8 series · 28 of 48 positions shown · IV contrast (gadavist)
Comparison: None.

CLINICAL DATA: Perianal fistula

EXAM:
MRI PELVIS WITHOUT AND WITH CONTRAST
TECHNIQUE: Multiplanar multisequence MR imaging of the pelvis was performed
both before and after administration of intravenous contrast.
CONTRAST:  7mL GADAVIST GADOBUTROL 1 MMOL/ML IV SOLN

[Series 2: T2 · sagittal · 2.5mm · 1.02mm/px · 6 of 43 slices shown (1 of 2)]
[im 1/43]
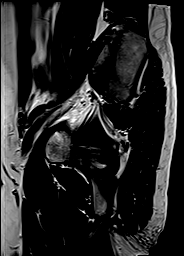
[im 9/43]
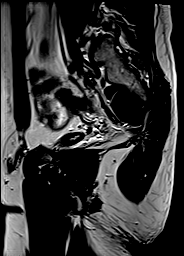
[im 17/43]
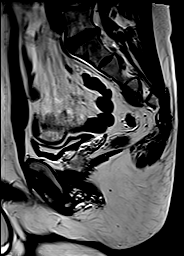
[im 26/43]
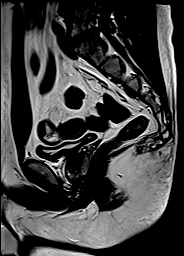
[im 34/43]
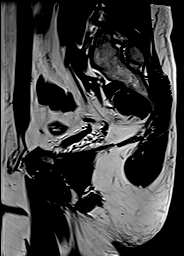
[im 43/43]
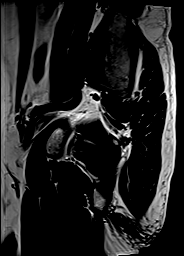

[Series 3: T2 fat-sat · sagittal · 2.5mm · 1.02mm/px · 6 of 43 slices shown (1 of 2)]
[im 1/43]
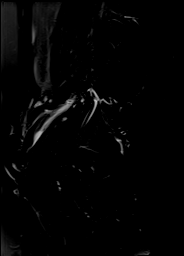
[im 9/43]
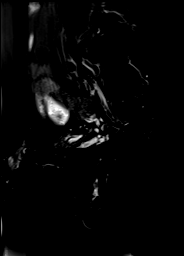
[im 17/43]
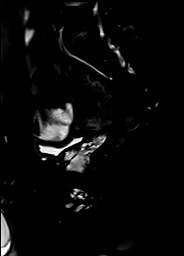
[im 26/43]
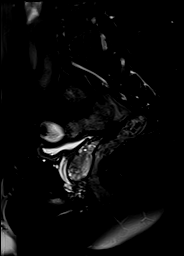
[im 34/43]
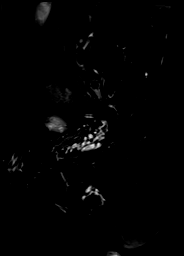
[im 43/43]
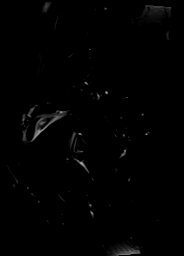

[Series 4: T1 · axial · 4.0mm · 0.43mm/px · z∈[-164,-25]mm · 6 of 34 slices shown]
[im 1/34]
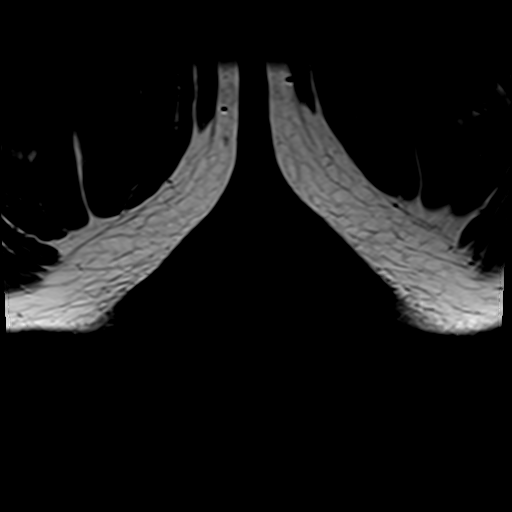
[im 7/34]
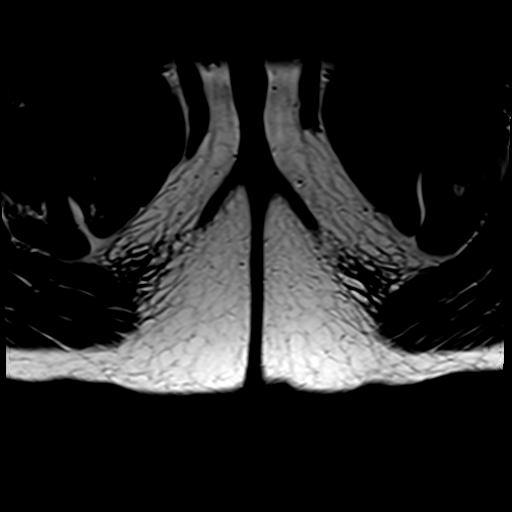
[im 14/34]
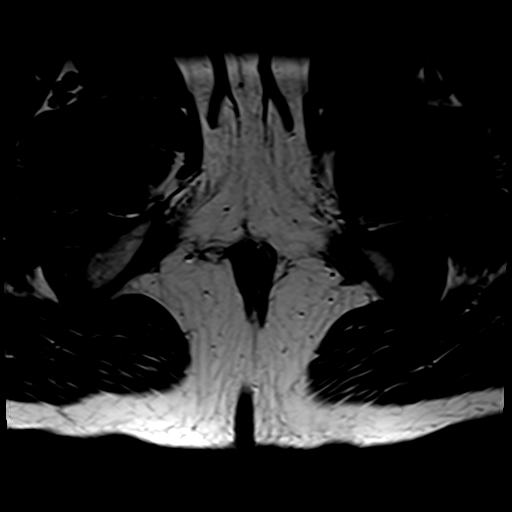
[im 20/34]
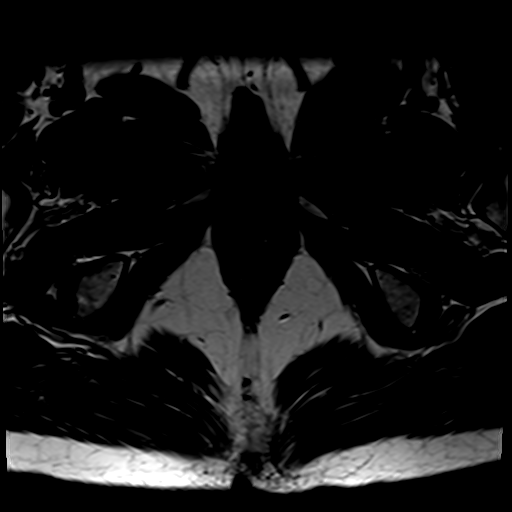
[im 27/34]
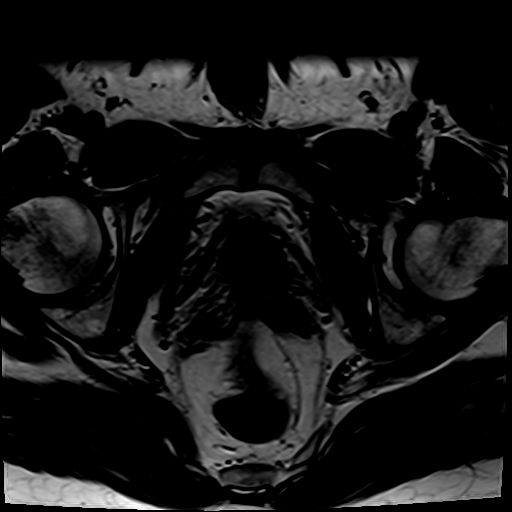
[im 34/34]
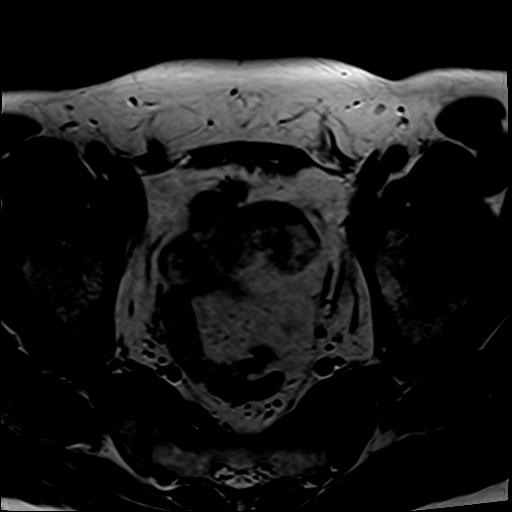

[Series 5: T2 · axial · 4.0mm · 0.43mm/px · z∈[-164,-25]mm · 6 of 34 slices shown (2 of 2)]
[im 1/34]
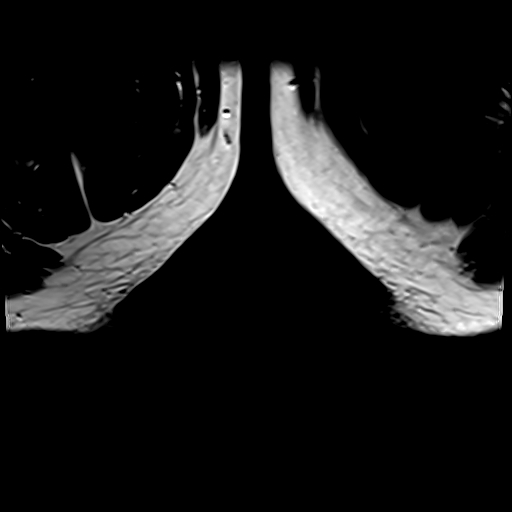
[im 7/34]
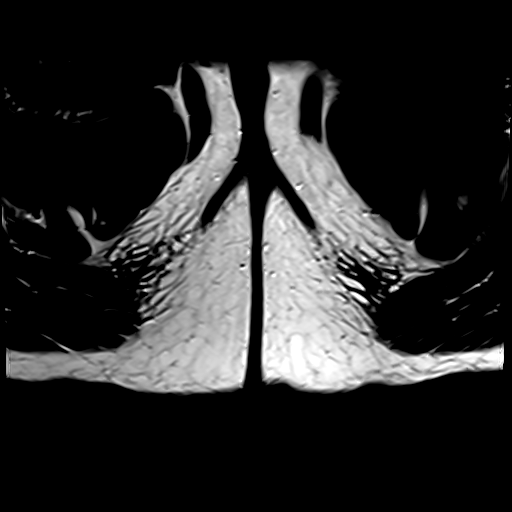
[im 14/34]
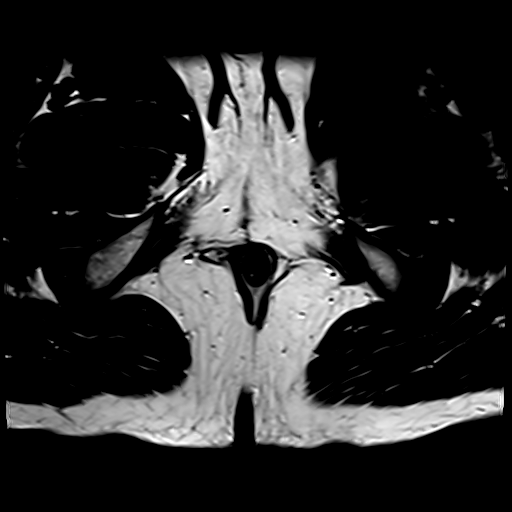
[im 20/34]
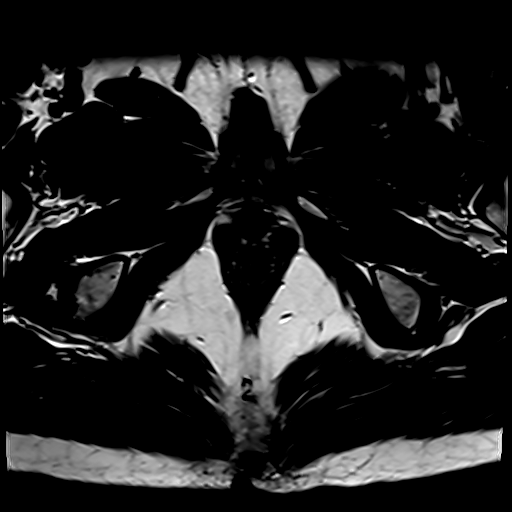
[im 27/34]
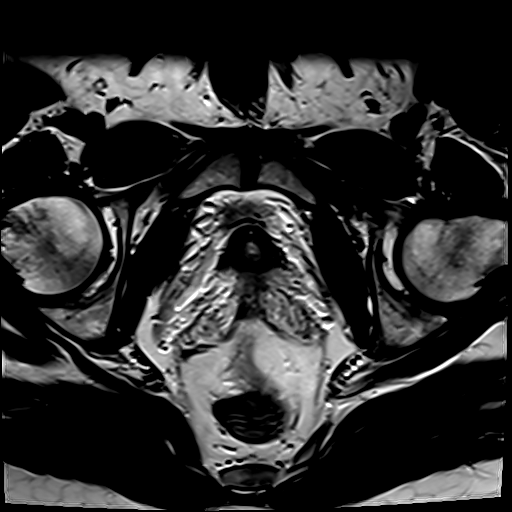
[im 34/34]
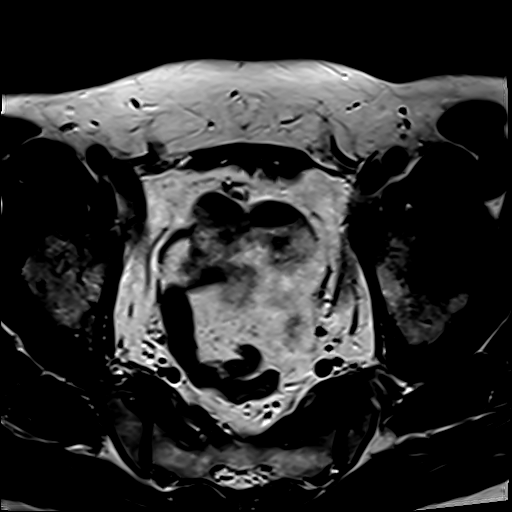

[Series 6: T2 fat-sat · axial · 4.0mm · 0.43mm/px · z∈[-164,-84]mm · 4 of 34 slices shown (2 of 2)]
[im 1/34]
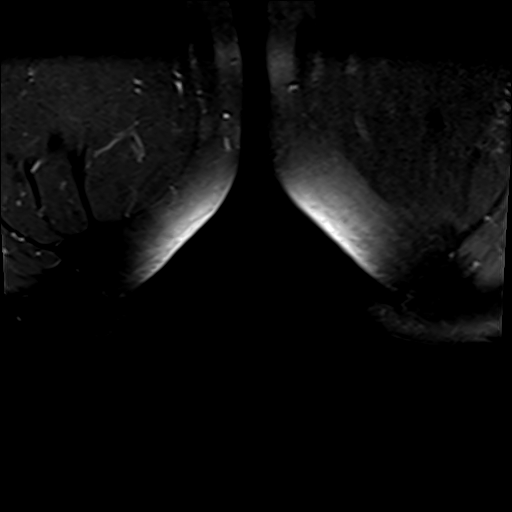
[im 7/34]
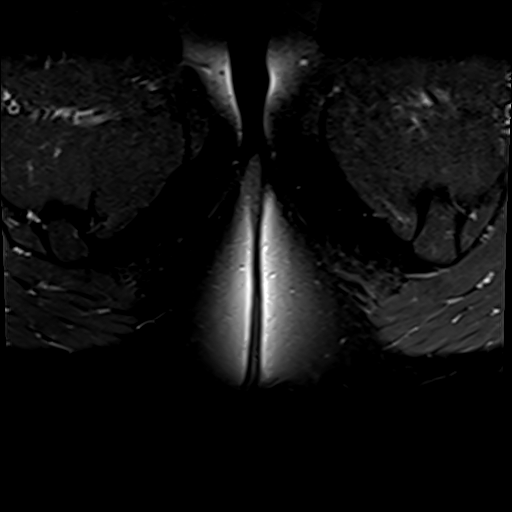
[im 14/34]
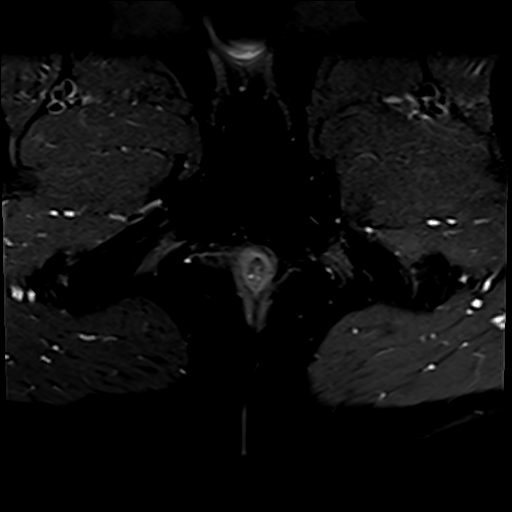
[im 20/34]
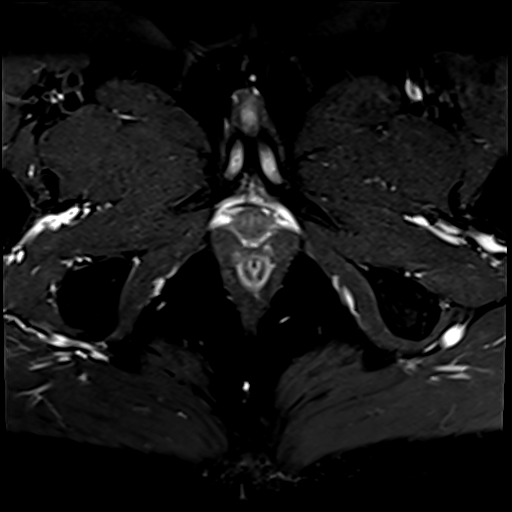

[28 of 48 positions shown; findings below may reference images not displayed]

FINDINGS: Urinary Tract:  No abnormality visualized.

Bowel: Unremarkable visualized pelvic bowel loops. No evidence of
contrast enhancing anal fistula tract or other inflammatory findings
in the pelvis.

Vascular/Lymphatic: No pathologically enlarged lymph nodes. No
significant vascular abnormality seen.

Reproductive:  No mass or other significant abnormality

Other:  None.

Musculoskeletal: Focal marrow edema of the lateral aspect of the
right femoral neck with associated contrast enhancement (series 5,
image 8, series 7, image 26, series 9, image 7).
IMPRESSION: 1. No evidence of anal fistula tract or other inflammatory findings
in the pelvis.
2. Focal marrow edema and contrast enhancement of the lateral aspect
of the right femoral neck. This is of uncertain significance, an
unusual location for stress fracture in this patient. As the hip is
incompletely assessed on this non tailored examination of the
pelvis, correlation with dedicated radiographs or MRI of the right
hip may be helpful.

These results will be called to the ordering clinician or
representative by the Radiologist Assistant, and communication
documented in the PACS or [REDACTED].

## 2022-09-26 ENCOUNTER — Encounter: Payer: Self-pay | Admitting: *Deleted

## 2023-07-21 ENCOUNTER — Encounter: Payer: Self-pay | Admitting: Medical
